# Patient Record
Sex: Female | Born: 1990 | Race: Black or African American | Hispanic: No | Marital: Single
Health system: Southern US, Community
[De-identification: ages and names within clinical notes are randomized; demographics above are authoritative.]

---

## 2015-04-21 ENCOUNTER — Encounter (HOSPITAL_COMMUNITY): Payer: Self-pay

## 2015-04-21 ENCOUNTER — Other Ambulatory Visit (HOSPITAL_COMMUNITY): Payer: Self-pay | Admitting: Unknown Physician Specialty

## 2015-04-21 ENCOUNTER — Ambulatory Visit (HOSPITAL_COMMUNITY)
Admission: RE | Admit: 2015-04-21 | Discharge: 2015-04-21 | Disposition: A | Discharge status: Home / Self Care | Payer: Medicaid - Out of State | Source: Ambulatory Visit | Attending: Unknown Physician Specialty | Admitting: Unknown Physician Specialty

## 2015-04-21 DIAGNOSIS — O99211 Obesity complicating pregnancy, first trimester: Secondary | ICD-10-CM

## 2015-04-21 DIAGNOSIS — Q793 Gastroschisis: Secondary | ICD-10-CM

## 2015-04-21 DIAGNOSIS — IMO0001 Reserved for inherently not codable concepts without codable children: Secondary | ICD-10-CM

## 2015-04-21 DIAGNOSIS — Z3A12 12 weeks gestation of pregnancy: Secondary | ICD-10-CM

## 2015-04-21 DIAGNOSIS — O358XX0 Maternal care for other (suspected) fetal abnormality and damage, not applicable or unspecified: Secondary | ICD-10-CM | POA: Diagnosis not present

## 2015-04-21 DIAGNOSIS — O35FXX0 Maternal care for other (suspected) fetal abnormality and damage, fetal musculoskeletal anomalies of trunk, not applicable or unspecified: Secondary | ICD-10-CM

## 2015-04-21 DIAGNOSIS — O99331 Smoking (tobacco) complicating pregnancy, first trimester: Secondary | ICD-10-CM | POA: Diagnosis not present

## 2015-04-21 DIAGNOSIS — Z315 Encounter for genetic counseling: Secondary | ICD-10-CM | POA: Insufficient documentation

## 2015-04-21 DIAGNOSIS — O359XX0 Maternal care for (suspected) fetal abnormality and damage, unspecified, not applicable or unspecified: Secondary | ICD-10-CM

## 2015-04-21 DIAGNOSIS — Z3A11 11 weeks gestation of pregnancy: Secondary | ICD-10-CM

## 2015-04-21 NOTE — Progress Notes (Signed)
Genetic Counseling  High-Risk Gestation Note  Appointment Date:  04/21/2015 Referred By: Gari Crown, MD Date of Birth:  1990-04-09 Partner:  Garlan Fair   Pregnancy History: [id number] Estimated Date of Delivery: 10/29/15 Estimated Gestational Age: 51w5dAttending: DRenella Cunas MD   I met with Ms. Stephanie Fixfor genetic counseling regarding abnormal ultrasound findings. Ms. MTorainmother and son also attended the genetic counseling appointment today.  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 Stephanie Walsh sent for ultrasound and consultation today regarding abnormal findings visualized by ultrasound at the referring provider's office. 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  The remainder of the fetal anatomy was not well visualized, given the early gestational age. The report will be documented separately.  We discussed these findings in detail.  Specifically, we discussed that congenital anomalies can occur as isolated, nonsyndromic birth defects, or as features of an underlying genetic syndrome. Ms. Stephanie Walsh was counseled that an omphalocele occurs in approximately in every 4,000 births (M1:F5) and is defined as the protrusion of abdominal viscera through the umbilical ring and covered by a membrane. This defect is thought to arise from failure of lateral body migration and body wall closure or from  the embryonic persistence of the body stalk. We discussed that about two thirds of all cases have associated anomalies, most commonly including: cardiac defects, neural tube defects, and cleft lip with or without palate.  We reviewed the common causes of omphaloceles, including sporadic, multifactorial, and genetic etiologies. Specifically, we discussed that omphaloceles are associated with a 30-50% risk of fetal aneuploidy (trisomies 13/18), other chromosome aberrations (microdeletions, microduplications, translocations, insertions), complexes such as OEIS, and genetic syndromes including Beckwith-Wiedemann syndrome (BWS) and specific single gene conditions. We reviewed chromosomes, nondisjunction, genes, and patterns of inheritance.   She was counseled regarding the option of noninvasive prenatal screening (NIPS). We reviewed that this technology evaluates fragments of fetal DNA found in maternal circulation to predict the chance of specific chromosome conditions in the fetus. Although highly specific and sensitive, this testing is not considered diagnostic. We reviewed that NIPS specifically assesses the risk of fetal Down syndrome, trisomy 161 trisomy 163 fetal sex chromosome aneuploidy, triploidy, and select microdeletions; currently, this technology cannot assess the risk for all chromosome aberrations or single gene conditions. Ms. Stephanie Walsh then counseled regarding the availability of diagnostic testing via [facility] and amniocentesis including the associated risks, benefits, and limitations. She understands that chromosome analysis can be performed both prenatally (amniotic fluid) and postnatally (peripheral blood or cord blood). Additionally, we discussed the availability of microarray analysis, which can also be performed pre and postnatally. She was counseled that microarray analysis is a molecular based technique in which a test sample of DNA (fetal) is compared to a reference (normal) genome in order to  determine if the test sample has any extra or missing genetic information. Microarray analysis allows for the detection of genetic deletions and duplications that are 1762times  smaller than those identified by routine chromosome analysis. We discussed that recent publications show that approximately 6% of patients with an abnormal fetal ultrasound and a normal fetal karyotype had a significant microdeletion/microduplication detected by prenatal microarray analysis. After thoughtful consideration of her options, Stephanie Walsh elected to have NIPS. The results will be available in ~5-7 business days.  She was then counseled that single gene conditions are typically tested for postnatally, based on the recommendation of a medical geneticist, unless ultrasound findings or the family history are strongly suggestive of a specific syndrome. The patient is scheduled to return in 3 weeks and again in 6 weeks for a limited and then detailed anatomy ultrasound.  We reviewed the availability of a postnatal medical genetics evaluation, to help determine whether the child has an underlying genetic syndrome.   We discussed that congenital anomalies can also result from teratogenic exposures. Stephanie Walsh denied the use of illicit substances, medications, or alcohol during this pregnancy.   We discussed management and prognosis. She understands that the overall prognosis depends upon the underlying etiology; however, if isolated and nonsyndromic, the prognosis is expected to be good. We reviewed the option of meeting with a pediatric surgeon to review the surgical approach and postnatal management. This will be coordinated following the patient's anatomy ultrasound. In addition, we also discussed the importance of delivering in tertiary care center, to optimize care of the newborn. We also discussed the recommendation for fetal echocardiogram, given the increased association of omphaloceles with CHDs. This needs to be scheduled.    Both family histories were reviewed and found to be noncontributory for birth defects, intellectual disability, and known genetic conditions. Without further information regarding the provided family history, an accurate genetic risk cannot be calculated. Further genetic counseling is warranted if more information is obtained.  I counseled this patient and her mother regarding the above risks and available options. The approximate face-to-face time with the genetic counselor was 48 minutes.  Stephanie Richters, MS Certified Genetic Counselor

## 2015-04-22 DIAGNOSIS — O358XX0 Maternal care for other (suspected) fetal abnormality and damage, not applicable or unspecified: Secondary | ICD-10-CM | POA: Insufficient documentation

## 2015-04-22 DIAGNOSIS — Z3A11 11 weeks gestation of pregnancy: Secondary | ICD-10-CM | POA: Insufficient documentation

## 2015-04-22 DIAGNOSIS — O35FXX0 Maternal care for other (suspected) fetal abnormality and damage, fetal musculoskeletal anomalies of trunk, not applicable or unspecified: Secondary | ICD-10-CM | POA: Insufficient documentation

## 2015-04-29 ENCOUNTER — Telehealth (HOSPITAL_COMMUNITY): Payer: Self-pay

## 2015-04-29 NOTE — Telephone Encounter (Signed)
Called Stephanie Walsh to discuss her prenatal cell free DNA test results.  Stephanie Walsh had Panorama testing through [facility].  Testing was offered because of abnormal ultrasound findings. The patient was identified by name and DOB.  We reviewed that the percentage of DNA from the pregnancy was too low to calculate a result. We reviewed the option of repeating this testing versus pursuing another type of testing for fetal chromosome conditions (amniocentesis). After thoughtful consideration, Stephanie Walsh elected to have a repeat NIPS drawn at the time of her follow-up ultrasound on 05/12/15. She understands that if a result is not obtained after a second sample, repeat testing is not recommended and alterate screening would need to be considered.  All questions were answered to her satisfaction, she was encouraged to call with additional questions or concerns.  Donald Prose, MS Certified Genetic Counselor

## 2015-05-05 ENCOUNTER — Other Ambulatory Visit (HOSPITAL_COMMUNITY): Payer: Self-pay

## 2015-05-11 ENCOUNTER — Other Ambulatory Visit (HOSPITAL_COMMUNITY): Payer: Self-pay

## 2015-05-12 ENCOUNTER — Ambulatory Visit (HOSPITAL_COMMUNITY)
Admission: RE | Admit: 2015-05-12 | Discharge: 2015-05-12 | Disposition: A | Discharge status: Home / Self Care | Payer: Medicaid - Out of State | Source: Ambulatory Visit | Attending: Unknown Physician Specialty | Admitting: Unknown Physician Specialty

## 2015-05-12 ENCOUNTER — Encounter (HOSPITAL_COMMUNITY): Payer: Self-pay

## 2015-05-12 DIAGNOSIS — O99332 Smoking (tobacco) complicating pregnancy, second trimester: Secondary | ICD-10-CM | POA: Diagnosis not present

## 2015-05-12 DIAGNOSIS — O99212 Obesity complicating pregnancy, second trimester: Secondary | ICD-10-CM | POA: Diagnosis not present

## 2015-05-12 DIAGNOSIS — O358XX0 Maternal care for other (suspected) fetal abnormality and damage, not applicable or unspecified: Secondary | ICD-10-CM | POA: Insufficient documentation

## 2015-05-12 DIAGNOSIS — IMO0001 Reserved for inherently not codable concepts without codable children: Secondary | ICD-10-CM

## 2015-05-12 DIAGNOSIS — Z3A15 15 weeks gestation of pregnancy: Secondary | ICD-10-CM | POA: Diagnosis not present

## 2015-05-12 DIAGNOSIS — O359XX0 Maternal care for (suspected) fetal abnormality and damage, unspecified, not applicable or unspecified: Secondary | ICD-10-CM

## 2015-05-19 ENCOUNTER — Telehealth (HOSPITAL_COMMUNITY): Payer: Self-pay

## 2015-05-19 NOTE — Telephone Encounter (Signed)
Left message re: Stephanie Walsh results. Patient asked to return my call. Donald Prose, MS CGC

## 2015-05-20 ENCOUNTER — Telehealth (HOSPITAL_COMMUNITY): Payer: Self-pay

## 2015-05-20 NOTE — Telephone Encounter (Signed)
Patient returned my call. Identified by name and DOB. Reviewed that the 2nd sample NIPS (Panorama) also showed too low of a fetal fraction (and other analytical factors) to provide a result. We reviewed that it is not recommended to repeat NIPS (Panorama) testing following 2 failed samples. We discussed that for those fetuses with low cfDNA, the risk of aneuploidy is slightly increased. Discussed the option of amniocentesis including the risks, benefits, and limitations. Ms. Stephanie Walsh is undecided regarding this option and wishes to have her detailed anatomy ultrasound (06/01/15) before deciding. She understands that a specific diagnosis cannot be definitively diagnosed by ultrasound alone; however, there are specific findings that may lend suspicion for a specific condition.  All questions answered and concerns addressed.  Despina Arias, MS Certified Genetic Counselor

## 2015-05-21 ENCOUNTER — Other Ambulatory Visit (HOSPITAL_COMMUNITY): Payer: Self-pay

## 2015-06-01 ENCOUNTER — Encounter (HOSPITAL_COMMUNITY): Payer: Medicaid - Out of State

## 2015-06-01 ENCOUNTER — Other Ambulatory Visit (HOSPITAL_COMMUNITY): Payer: Medicaid - Out of State

## 2015-06-02 ENCOUNTER — Encounter (HOSPITAL_COMMUNITY): Payer: Medicaid - Out of State

## 2015-06-02 ENCOUNTER — Other Ambulatory Visit (HOSPITAL_COMMUNITY): Payer: Medicaid - Out of State

## 2015-06-03 ENCOUNTER — Ambulatory Visit (HOSPITAL_COMMUNITY): Admission: RE | Admit: 2015-06-03 | Payer: Medicaid - Out of State | Source: Ambulatory Visit

## 2015-06-03 ENCOUNTER — Other Ambulatory Visit (HOSPITAL_COMMUNITY): Payer: Self-pay | Admitting: Maternal and Fetal Medicine

## 2015-06-03 ENCOUNTER — Encounter (HOSPITAL_COMMUNITY): Payer: Self-pay

## 2015-06-03 ENCOUNTER — Ambulatory Visit (HOSPITAL_COMMUNITY)
Admission: RE | Admit: 2015-06-03 | Discharge: 2015-06-03 | Disposition: A | Discharge status: Home / Self Care | Payer: Medicaid - Out of State | Source: Ambulatory Visit | Attending: Maternal and Fetal Medicine | Admitting: Maternal and Fetal Medicine

## 2015-06-03 DIAGNOSIS — Q793 Gastroschisis: Secondary | ICD-10-CM

## 2015-06-03 DIAGNOSIS — O99212 Obesity complicating pregnancy, second trimester: Secondary | ICD-10-CM | POA: Insufficient documentation

## 2015-06-03 DIAGNOSIS — O358XX0 Maternal care for other (suspected) fetal abnormality and damage, not applicable or unspecified: Secondary | ICD-10-CM | POA: Insufficient documentation

## 2015-06-03 DIAGNOSIS — Z3A18 18 weeks gestation of pregnancy: Secondary | ICD-10-CM | POA: Diagnosis not present

## 2015-06-03 DIAGNOSIS — O359XX0 Maternal care for (suspected) fetal abnormality and damage, unspecified, not applicable or unspecified: Secondary | ICD-10-CM

## 2015-06-03 LAB — ROUTINE CHROMOSOME - KARYOTYPE + FISH

## 2015-06-04 ENCOUNTER — Telehealth (HOSPITAL_COMMUNITY): Payer: Self-pay

## 2015-06-04 NOTE — Telephone Encounter (Signed)
Called Claudina LickOctavia Pruss to discuss her FISH analysis results. Ms. Claudina LickOctavia Ditmore had amniocentesis yesterday following her anatomy ultrasound, which revealed BOO and omphalocele. 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 We discussed that the final results are pending and will be available in ~1-2 weeks. We again reviewed the features of trisomy 3218 and the prognosis. Ms. Daphine DeutscherMartin was very upset and tearful. We reviewed the options of continuing the pregnancy and transfer of care to MFM versus TOP. We reviewed the legal limit for TOP in [place] and surrounding states. Ms. Daphine DeutscherMartin was undecided regarding her options. I offered the patient an [facility]; Ms. Daphine DeutscherMartin stated that she has a f/u u/s on 3/29 and wanted to be seen at that time. I told her that I'd be happy to see her at any time, but reminded her that she will be greater than [redacted] weeks gestation at the time of her next ultrasound appt. The patient was again tearful. She wishes to discuss the results and options with the FOB. I will call her tomorrow morning to f/u.  Given how upset she was, I urged her not to drive and to call a friend or family member for support. She agreed to do so. All questions were answered to her satisfaction, she was encouraged to call with additional questions or concerns.  Despina Ariashristy Phillipe Clemon, MS Certified Genetic Counselor

## 2015-06-05 ENCOUNTER — Telehealth (HOSPITAL_COMMUNITY): Payer: Self-pay

## 2015-06-05 NOTE — Telephone Encounter (Signed)
Called Stephanie Walsh to follow-up from our conversation yesterday. She stated that she had talked with her partner and her family and that given the severity of the ultrasound findings and the diagnosis, they would like to terminate the pregnancy. I offered to meet with the patient to discuss the condition in greater detail, if they had any questions about the condition, ultrasound findings, or options. Ms. Stephanie Walsh declined. I reviewed TOP with the patient. [facility] I will contact our provider and schedule the patient for an options consult.    All questions answered and concerns addressed. Donald Prosehristy S. Jeronimo Hellberg, MS CGC

## 2015-06-10 ENCOUNTER — Other Ambulatory Visit (HOSPITAL_COMMUNITY): Payer: Self-pay | Admitting: *Deleted

## 2015-06-10 DIAGNOSIS — O358XX0 Maternal care for other (suspected) fetal abnormality and damage, not applicable or unspecified: Secondary | ICD-10-CM

## 2015-06-10 DIAGNOSIS — O35FXX0 Maternal care for other (suspected) fetal abnormality and damage, fetal musculoskeletal anomalies of trunk, not applicable or unspecified: Secondary | ICD-10-CM

## 2015-06-17 ENCOUNTER — Ambulatory Visit (HOSPITAL_COMMUNITY): Payer: Medicaid - Out of State

## 2015-06-18 ENCOUNTER — Ambulatory Visit (HOSPITAL_COMMUNITY): Payer: Medicaid - Out of State

## 2015-06-19 ENCOUNTER — Other Ambulatory Visit (HOSPITAL_COMMUNITY): Payer: Self-pay

## 2016-02-24 ENCOUNTER — Encounter (HOSPITAL_COMMUNITY): Payer: Self-pay

## 2018-01-05 IMAGING — US US MFM OB TRANSVAGINAL
1 series · 14 of 28 positions shown · non-contrast
Comparison: none

[Series 1: us mfm ob transvaginal · 66 acquisitions, 14 frames shown]
[im 3/66]
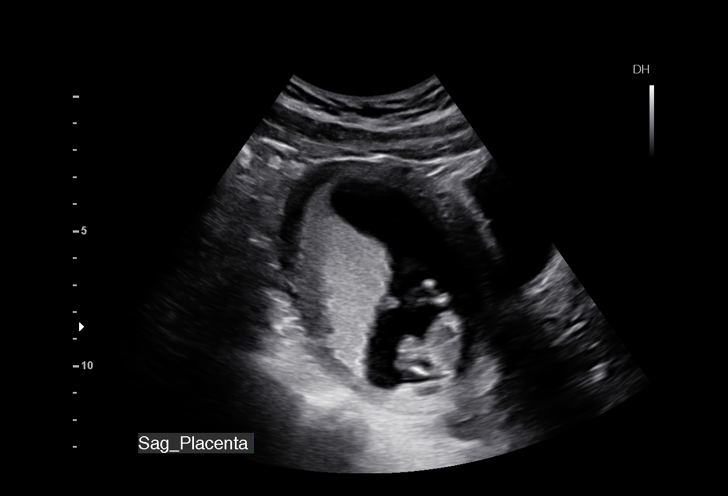
[im 8/66]
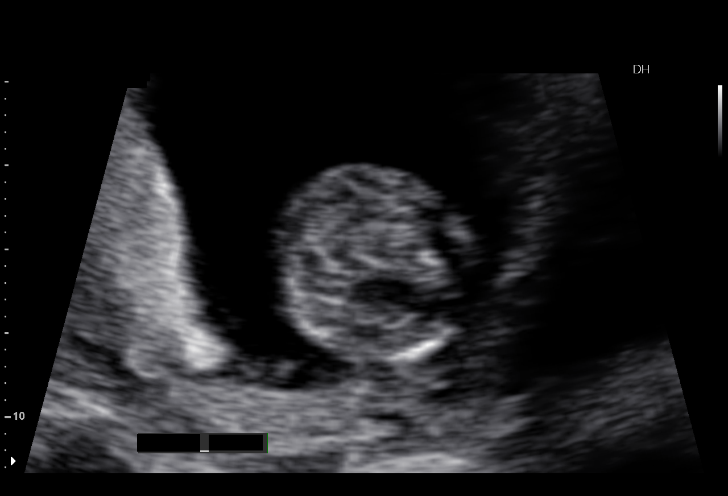
[im 13/66]
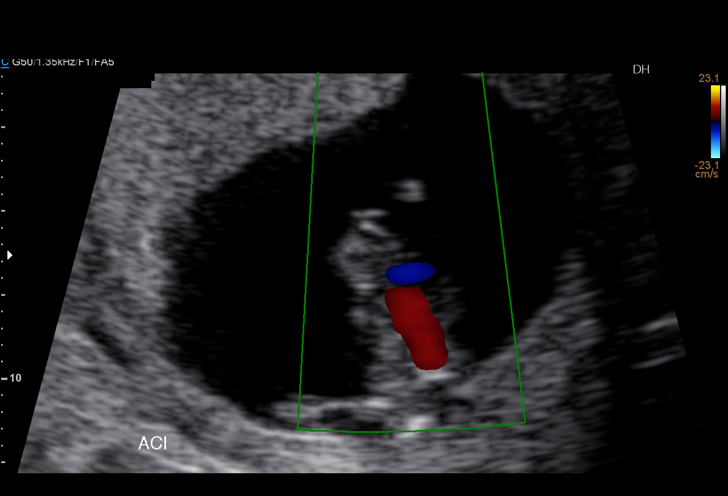
[im 17/66]
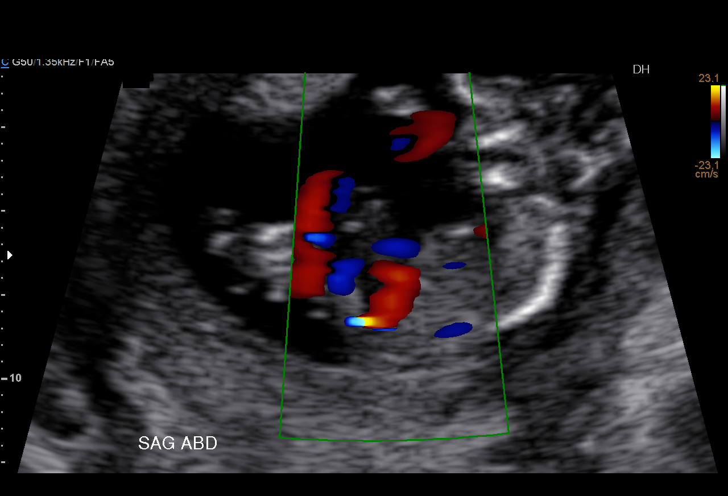
[im 22/66]
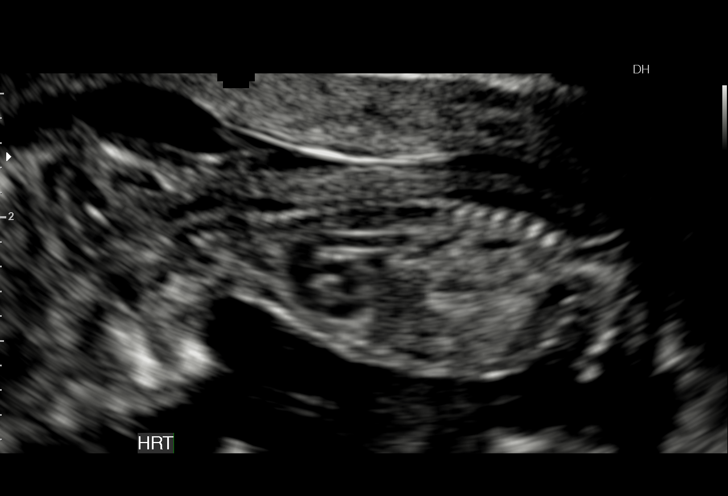
[im 27/66]
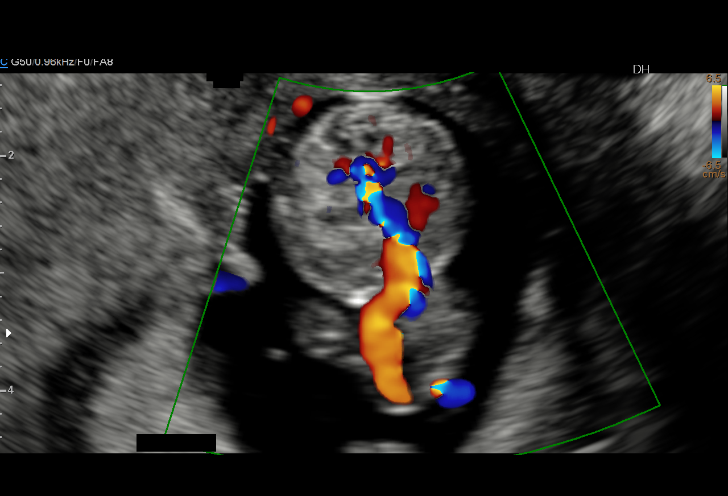
[im 32/66]
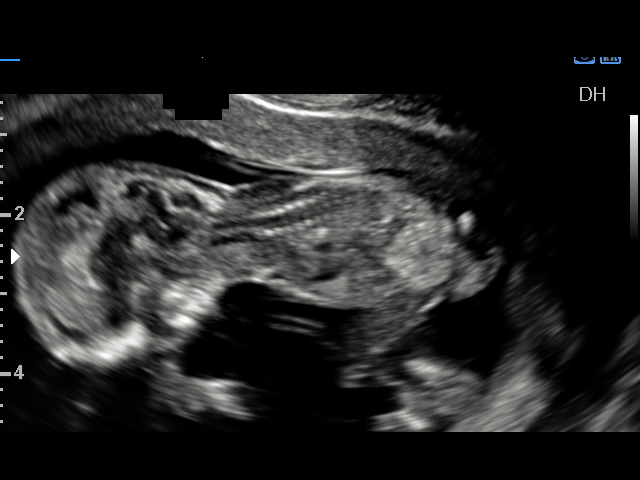
[im 37/66]
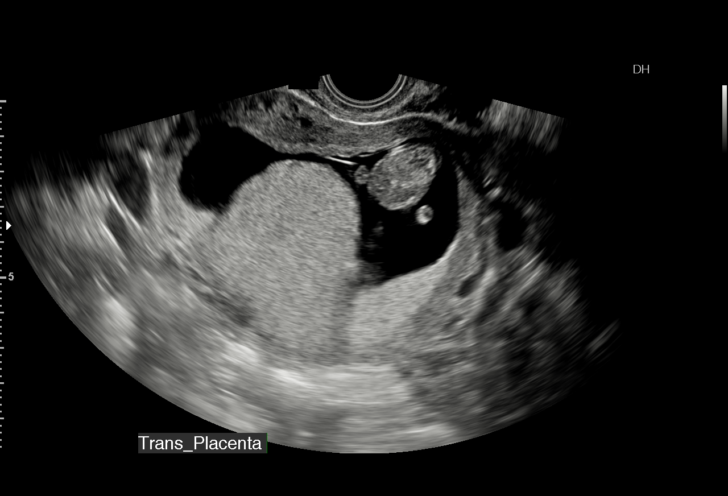
[im 41/66]
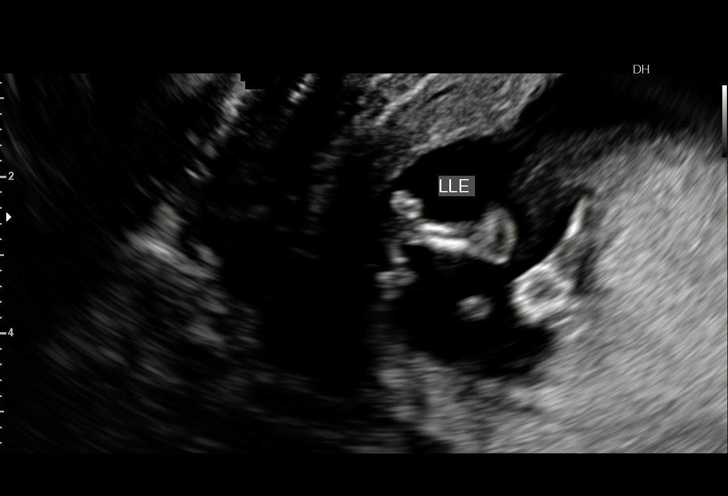
[im 46/66]
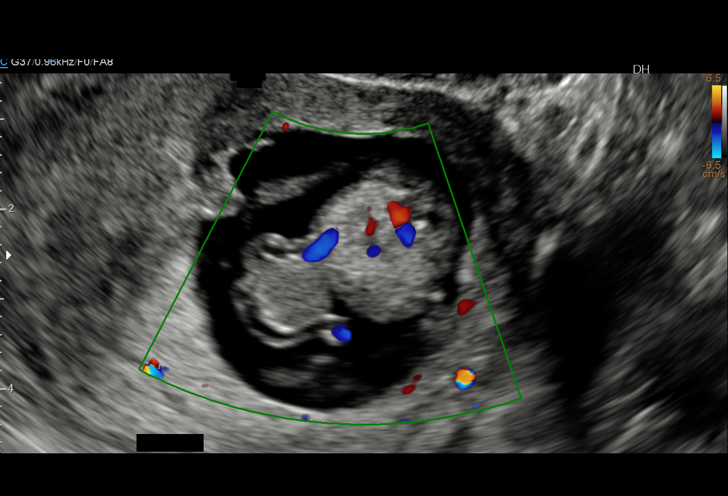
[im 51/66]
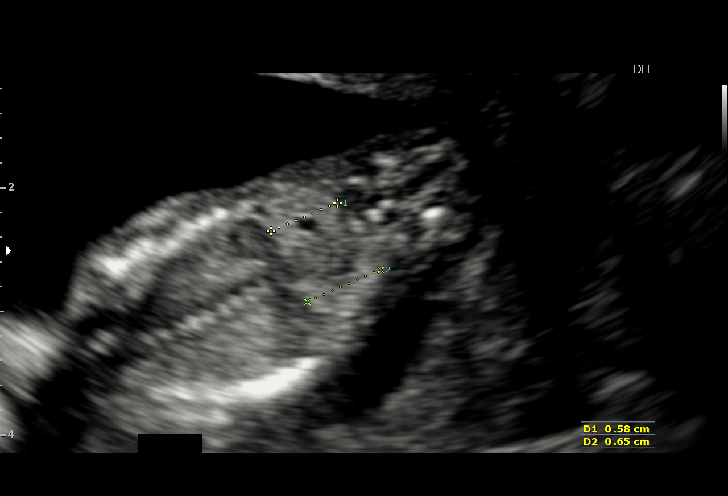
[im 56/66]
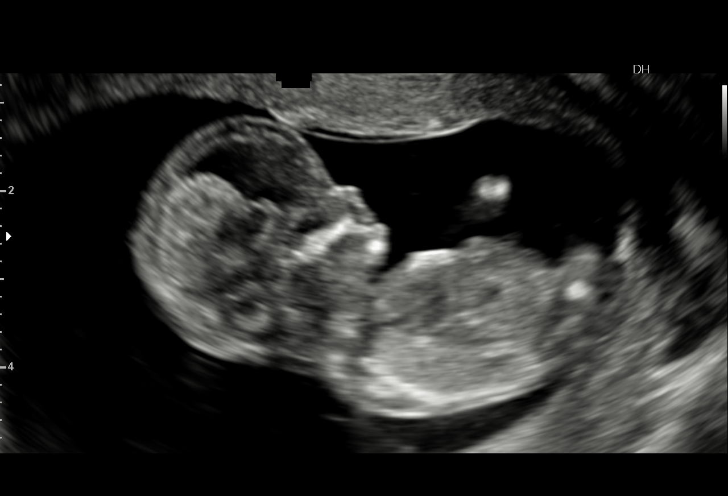
[im 61/66]
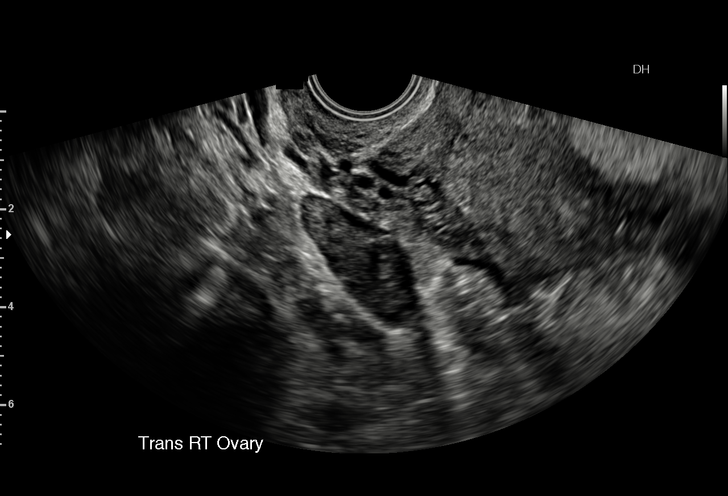
[im 66/66]
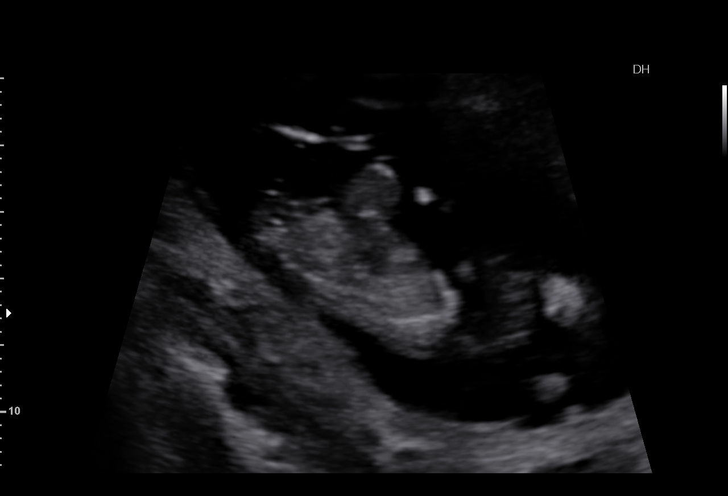

[14 of 28 positions shown; findings below may reference images not displayed]

pm)

Name:       KAROLINA DAO                        Visit  04/21/2015 [DATE]
Date:

[address]
[place], [HOSPITAL]

WEEKS

1  MAYAGOZELL MANSYROW              [id number]       [id number]     [id number]
2  MAYAGOZELL MANSYROW              [id number]       [id number]     [id number]
Indications

12 weeks gestation of pregnancy
Fetal abnormality - other known or
suspected (gastroschisis)
Tobacco use complicating pregnancy, first
trimester
Obesity complicating pregnancy, first
trimester
OB History

Gravidity:     3         Term:  1        Prem:    0        SAB:   1
TOP:           0       Ectopic  0        Living:  1
:
Fetal Evaluation

Num Of Fetuses:      1
Fetal Heart          164
Rate(bpm):
Cardiac Activity:    Observed
Presentation:        Variable
Placenta:            Posterior
Amniotic Fluid
AFI FV:      Subjectively within normal limits
Biometry

CRL:      60.3  mm     G. Age:   12w 2d                  EDD:   11/01/15
Gestational Age

Best:          12w 5d    Det. By:   Early Ultrasound          EDD:   10/29/15
(04/14/15)
Anatomy

Cranium:          Appears normal         Kidneys:           Present
Choroid Plexus:   Appears normal         Upper              Visualized
Extremities:
Stomach:          Appears normal,        Lower              Visualized
left sided             Extremities:
Abdominal         Abnormal, see
Wall:             comments
Cervix Uterus Adnexa

Cervix
Normal appearance by transvaginal scan

Uterus
Intrauterine synechiae seen in LUS

Left Ovary
Within normal limits.

Right Ovary
Within normal limits.

Adnexa:       No abnormality visualized.
Impression

SIUP at 12+5 weeks
Omphalocele; poss single umbilical artery; ? abnl profile -
hypoplastic NB
Normal amniotic fluid volume
Measurements consistant with prior U/S

The US findings were shared with Ms. Amazigh. The
implications of the above abnormalities were discussed in
detail. She received genetic counseling and all of her
prenatal testing and pregnancy management options were
reviewed. After careful consideration, she decided to have
cell free DNA screening (Panorama).
Recommendations

Follow-up ultrasound in 3 weeks to reassess early anatomy
Follow-up ultrasound in 6 weeks for detailed anatomic
survey
Please see genetic counseling note

## 2018-01-26 IMAGING — US US MFM OB LIMITED
1 series · 15 of 28 positions shown · non-contrast
Comparison: none

[Series 1: us mfm ob limited · 47 acquisitions, 15 frames shown]
[im 1/47]
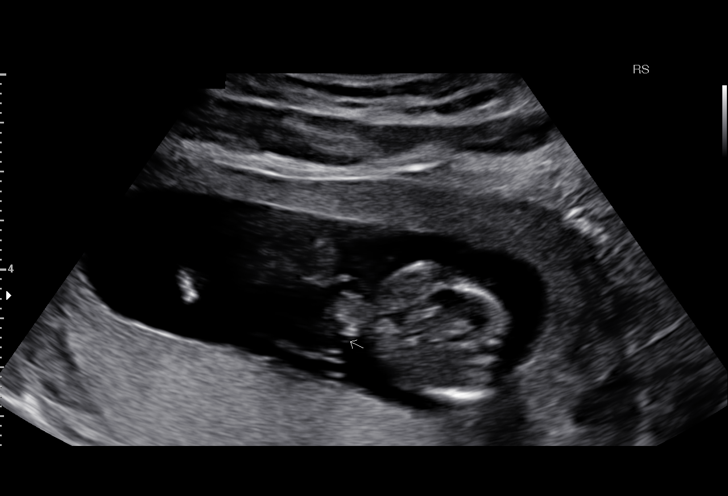
[im 4/47]
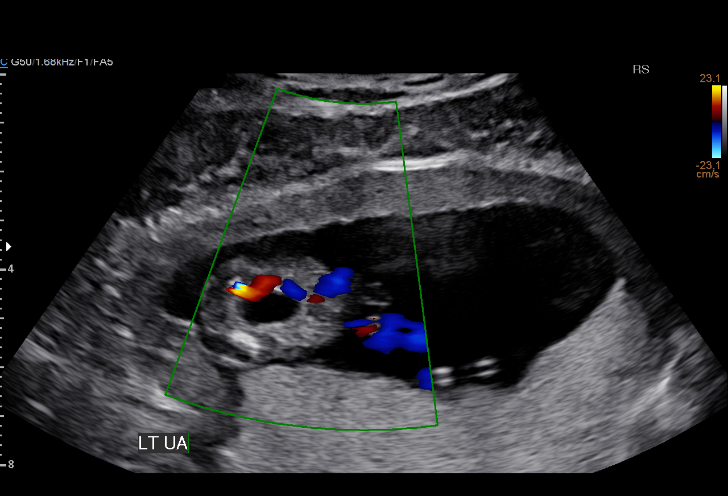
[im 7/47]
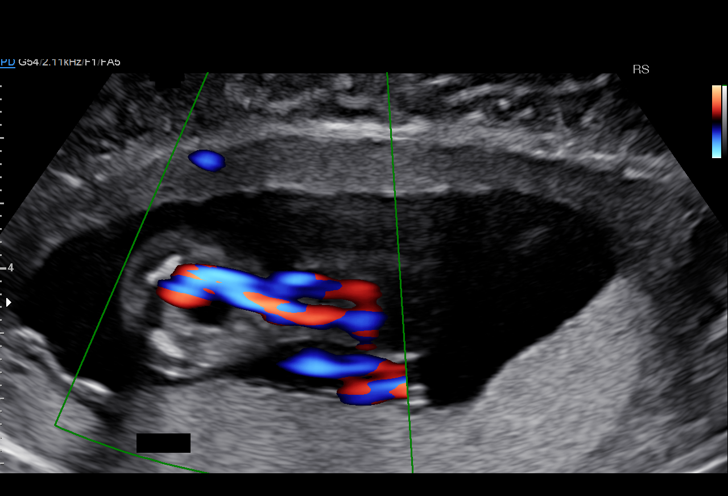
[im 11/47]
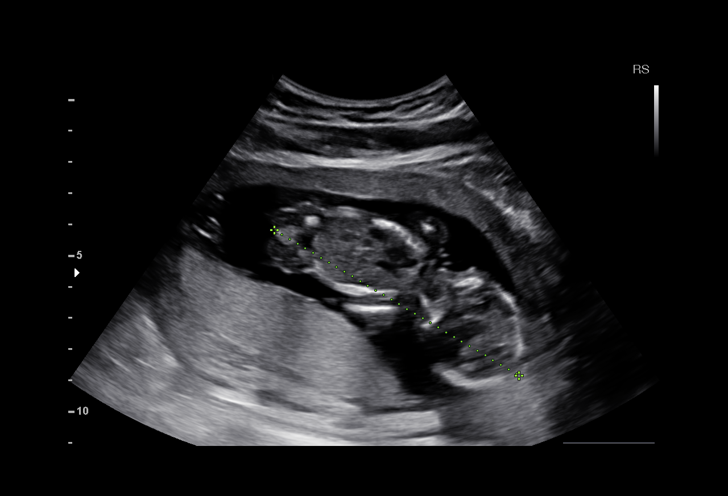
[im 14/47]
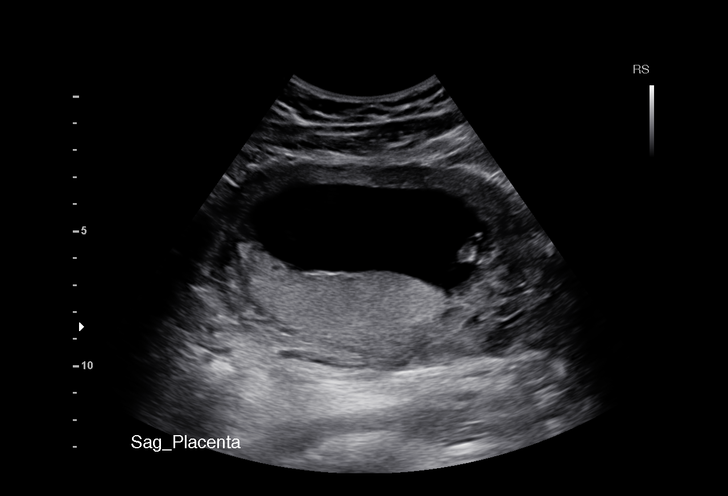
[im 18/47]
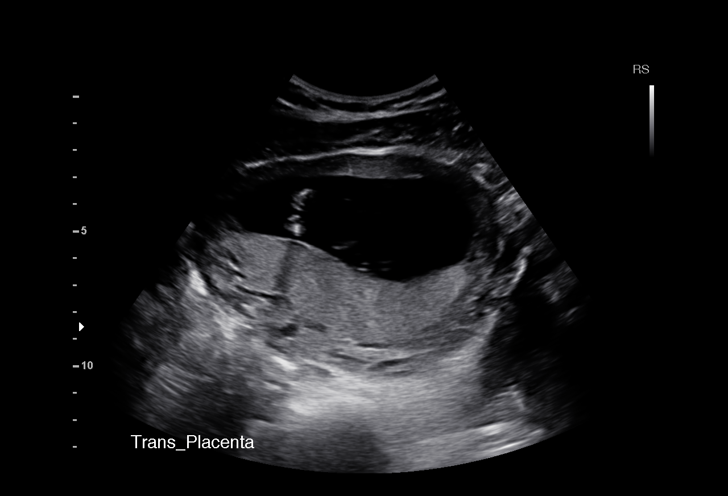
[im 21/47]
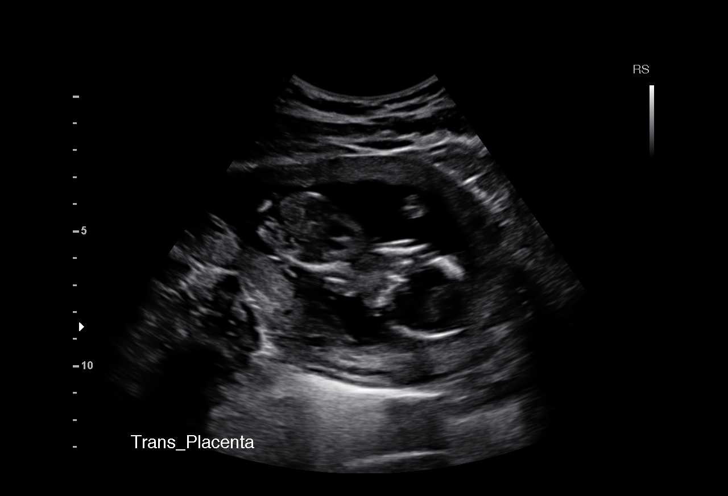
[im 24/47]
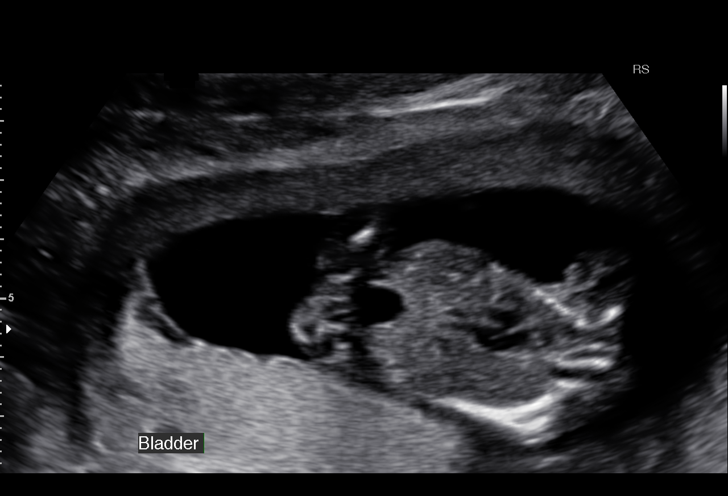
[im 26/47]
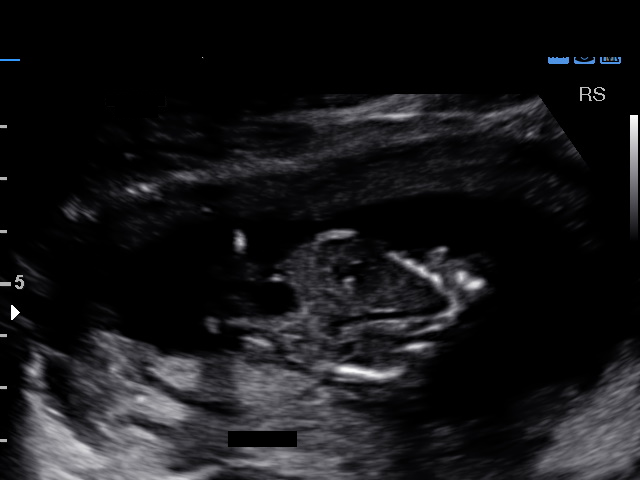
[im 29/47]
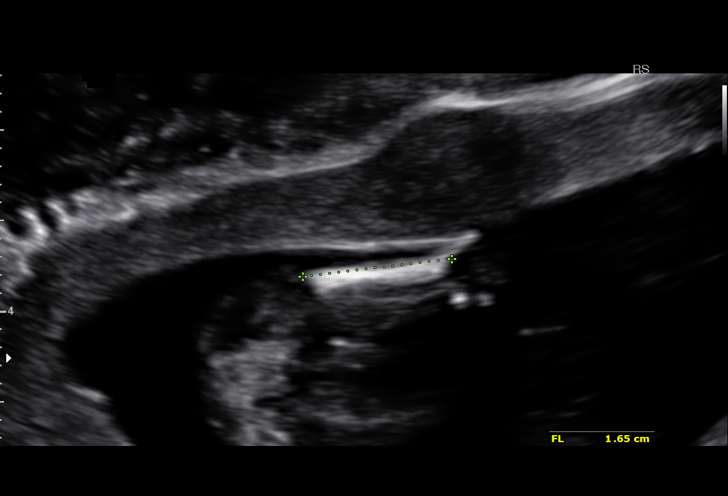
[im 33/47]
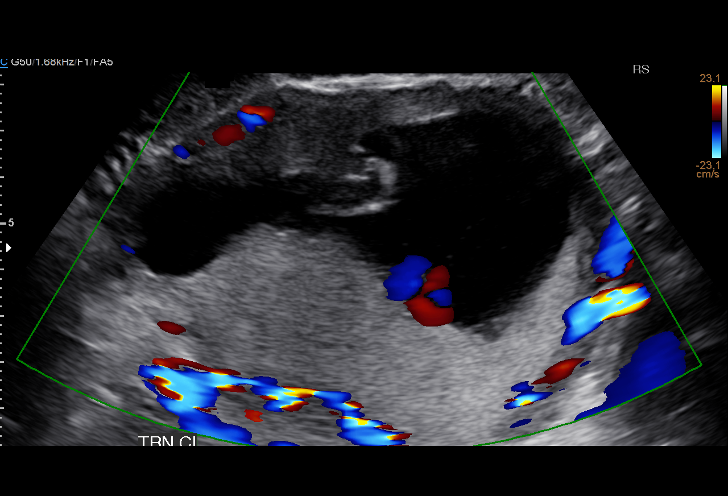
[im 36/47]
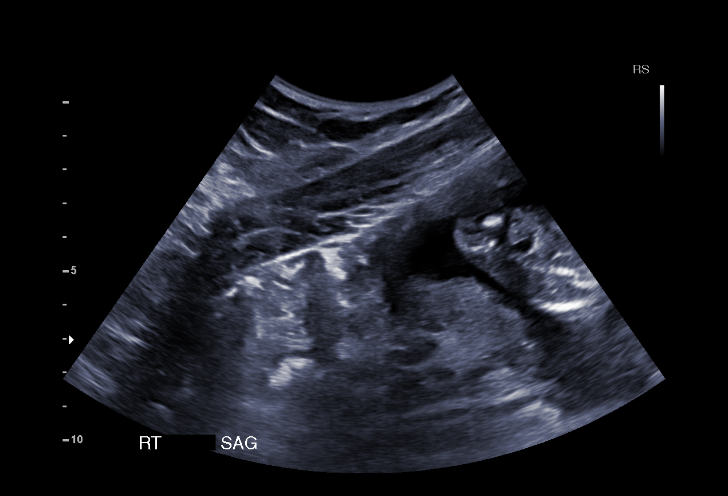
[im 40/47]
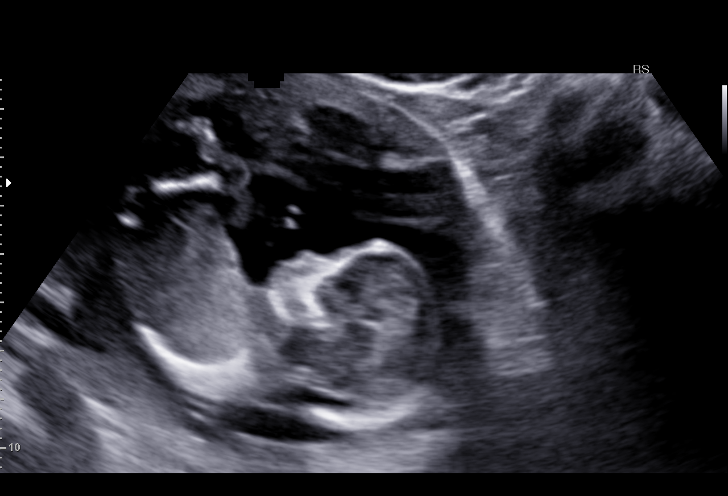
[im 43/47]
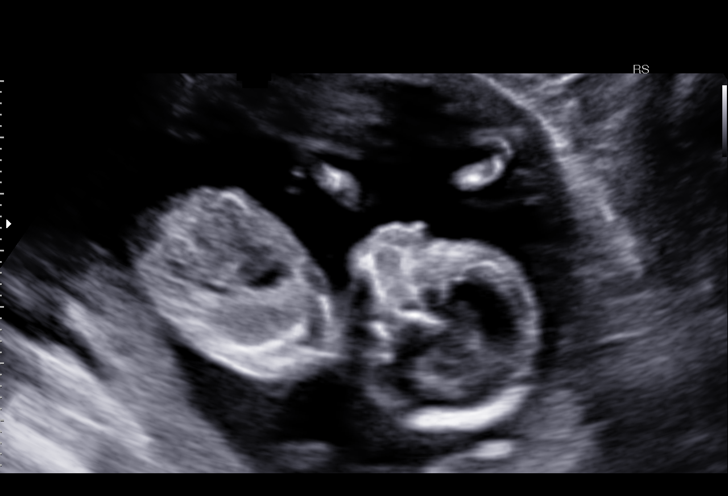
[im 47/47]
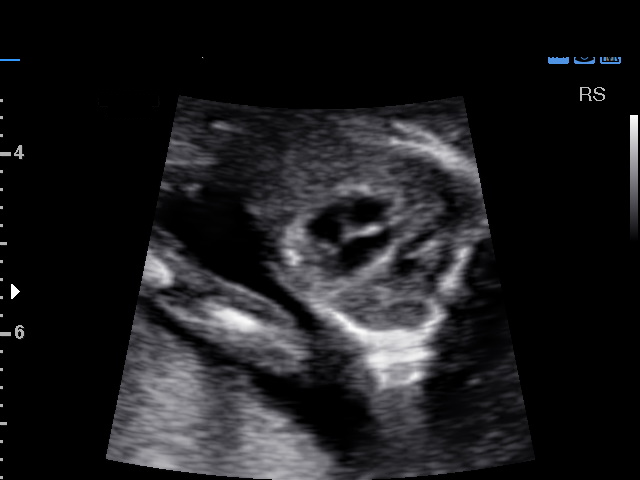

[15 of 28 positions shown; findings below may reference images not displayed]

Centre
[address],

1  [address]            [id number]      [id number]     [id number]
Indications

15 weeks gestation of pregnancy
Fetal abnormality - other known or
suspected (gastroschisis)
Obesity complicating pregnancy, second
trimester
Tobacco use complicating pregnancy,
second trimester
OB History

Gravidity:    3         Term:   1        Prem:   0        SAB:   1
TOP:          0       Ectopic:  0        Living: 1
Fetal Evaluation

Num Of Fetuses:     1
Fetal Heart         157
Rate(bpm):
Cardiac Activity:   Observed
Presentation:       Variable
Placenta:           Posterior
P. Cord Insertion:  Visualized

Amniotic Fluid
AFI FV:      Subjectively within normal limits
Larg Pckt:    4.09  cm
Biometry

CRL:      91.2  mm     G. Age:  N/A                     EDD:

FL:       16.8  mm     G. Age:  15w 0d         18  %
Gestational Age

U/S Today:     15w 0d                                        EDD:   11/03/15
Best:          15w 5d     Det. By:  Early Ultrasound         EDD:   10/29/15
(04/14/15)
Anatomy

Choroid Plexus:   Appears normal         Cord Vessels:     2 vessel cord,
absent right Ivy
Wuri
Stomach:          Appears normal, left   Bladder:          Appears normal
sided
Abdominal Wall:   Omphalocele
Cervix Uterus Adnexa

Left Ovary
Within normal limits.

Right Ovary
Not visualized.

Adnexa:       No abnormality visualized. No adnexal mass
visualized.
Impression

Single IUP at 15w 5d
Single umbilical artery
Omphalocele
Possible absent nasal bone
Somewhat limited views of the fetal anatomy obtained due to
early gestaitonal age

Previous NIPT - low fetal fraction; unable to report.  Plan
repeat Panorama today.
Recommendations

Ultrasound in 3 weeks for anatomy
Recommend fetal echo at 20-22 weeks
Would offer amniocentesis at next visit if Panorama
inconclusive.
